# Patient Record
Sex: Male | Born: 1985 | Race: White | Hispanic: No | Marital: Single | Smoking: Never smoker
Health system: Southern US, Community
[De-identification: ages and names within clinical notes are randomized; demographics above are authoritative.]

---

## 2013-11-29 ENCOUNTER — Emergency Department (INDEPENDENT_AMBULATORY_CARE_PROVIDER_SITE_OTHER)
Admission: EM | Admit: 2013-11-29 | Discharge: 2013-11-29 | Disposition: A | Discharge status: Home / Self Care | Payer: No Typology Code available for payment source | Source: Home / Self Care | Attending: Family Medicine | Admitting: Family Medicine

## 2013-11-29 ENCOUNTER — Encounter (HOSPITAL_COMMUNITY): Payer: Self-pay | Admitting: Family Medicine

## 2013-11-29 DIAGNOSIS — H938X2 Other specified disorders of left ear: Secondary | ICD-10-CM

## 2013-11-29 DIAGNOSIS — H6123 Impacted cerumen, bilateral: Secondary | ICD-10-CM

## 2013-11-29 MED ORDER — ANTIPYRINE-BENZOCAINE 5.4-1.4 % OT SOLN: 3.0000 [drp] | Freq: Once | OTIC | Status: DC

## 2013-11-29 MED ORDER — IBUPROFEN 800 MG PO TABS
800.0000 mg | ORAL_TABLET | Freq: Once | ORAL | Status: AC
  Administered 2013-11-29: 800 mg via ORAL

## 2013-11-29 MED ORDER — ANTIPYRINE-BENZOCAINE 5.4-1.4 % OT SOLN: 3.0000 [drp] | OTIC | Status: DC | PRN

## 2013-11-29 MED ORDER — IBUPROFEN 800 MG PO TABS
ORAL_TABLET | ORAL | Status: AC
  Filled 2013-11-29: qty 1

## 2013-11-29 NOTE — Discharge Instructions (Signed)
The cause of your symptoms is likely from ear wax buildup in your ear canal. Your ears were only partially cleaned in our office due to the severity of the impaction  Remember to use a 1:1 H2O2:water mixture in your ears daily until they clear. Consider call[facility] for an appointment to have them cleaned if they do not improve. Please take ibuprofen 600mg  every 6 hours for a couple days if you are not better as this will help with possible eustachian tube dysfunction Consider starting nightly flonase (treat for at least 2 weeks) if you continue to have problems. This nasal steroid dose an excellent job of decreasing inflammation around the tube

## 2013-11-29 NOTE — ED Notes (Signed)
28 year old male with complaints of pain and fullness to his left ear.
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
## 2014-07-02 ENCOUNTER — Encounter (HOSPITAL_BASED_OUTPATIENT_CLINIC_OR_DEPARTMENT_OTHER): Payer: Self-pay

## 2014-07-02 ENCOUNTER — Emergency Department (HOSPITAL_BASED_OUTPATIENT_CLINIC_OR_DEPARTMENT_OTHER)
Admission: EM | Admit: 2014-07-02 | Discharge: 2014-07-02 | Disposition: A | Discharge status: Home / Self Care | Payer: No Typology Code available for payment source | Attending: Emergency Medicine | Admitting: Emergency Medicine

## 2014-07-02 ENCOUNTER — Emergency Department (HOSPITAL_BASED_OUTPATIENT_CLINIC_OR_DEPARTMENT_OTHER): Payer: No Typology Code available for payment source

## 2014-07-02 DIAGNOSIS — S92511A Displaced fracture of proximal phalanx of right lesser toe(s), initial encounter for closed fracture: Secondary | ICD-10-CM | POA: Insufficient documentation

## 2014-07-02 DIAGNOSIS — Y998 Other external cause status: Secondary | ICD-10-CM | POA: Insufficient documentation

## 2014-07-02 DIAGNOSIS — Y92828 Other wilderness area as the place of occurrence of the external cause: Secondary | ICD-10-CM | POA: Insufficient documentation

## 2014-07-02 DIAGNOSIS — Y9389 Activity, other specified: Secondary | ICD-10-CM | POA: Insufficient documentation

## 2014-07-02 DIAGNOSIS — W231XXA Caught, crushed, jammed, or pinched between stationary objects, initial encounter: Secondary | ICD-10-CM | POA: Insufficient documentation

## 2014-07-02 DIAGNOSIS — S92911A Unspecified fracture of right toe(s), initial encounter for closed fracture: Secondary | ICD-10-CM

## 2014-07-02 DIAGNOSIS — Y9316 Activity, rowing, canoeing, kayaking, rafting and tubing: Secondary | ICD-10-CM | POA: Insufficient documentation
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
## 2014-07-02 NOTE — ED Notes (Signed)
Patient transported to X-ray 

## 2014-07-02 NOTE — ED Notes (Signed)
Injury to right 4th toe 48 hours ago

## 2014-07-02 NOTE — Discharge Instructions (Signed)
Please call your doctor for a followup appointment within 24-48 hours. When you talk to your doctor please let them know that you were seen in the emergency department and have them acquire all of your records so that they can discuss the findings with you and formulate a treatment plan to fully care for your new and ongoing problems. Please follow-up with orthopedics Please rest and stay hydrated Please keep postop shoe on at all times and keep buddy taping the toes together Please rest, ice, elevate Please avoid any strenuous activity Please continue to monitor symptoms closely and if symptoms are to worsen or change (fever greater than 101, chills, sweating, nausea, vomiting, chest pain, shortness of breathe, difficulty breathing, weakness, numbness, tingling, worsening or changes to pain pattern, fall, injury, swelling, loss of sensation, changes to skin color) please report back to the Emergency Department immediately.    Buddy Taping of Toes We have taped your toes together to keep them from moving. This is called "buddy taping" since we used a part of your own body to keep the injured part still. We placed soft padding between your toes to keep them from rubbing against each other. Buddy taping will help with healing and to reduce pain. Keep your toes buddy taped together for as long as directed by your caregiver. HOME CARE INSTRUCTIONS   Raise your injured area above the level of your heart while sitting or lying down. Prop it up with pillows.  An ice pack used every twenty minutes, while awake, for the first one to two days may be helpful. Put ice in a plastic bag and put a towel between the bag and your skin.  Watch for signs that the taping is too tight. These signs may be:  Numbness of your taped toes.  Coolness of your taped toes.  Color change in the area beyond the tape.  Increased pain.  If you have any of these signs, loosen or rewrap the tape. If you need to loosen or  rewrap the buddy tape, make sure you use the padding again. SEEK IMMEDIATE MEDICAL CARE IF:   You have worse pain, swelling, inflammation (soreness), drainage or bleeding after you rewrap the tape.  Any new problems occur. MAKE SURE YOU:   Understand these instructions.  Will watch your condition.  Will get help right away if you are not doing well or get worse. Document Released: 10/09/2003 Document Revised: 03/29/2011 Document Reviewed: 01/02/2008 [facility] Patient Information 2015 [place], [place]. This information is not intended to replace advice given to you by your health care provider. Make sure you discuss any questions you have with your health care provider. Toe Fracture Your caregiver has diagnosed you as having a fractured toe. A toe fracture is a break in the bone of a toe. "Buddy taping" is a way of splinting your broken toe, by taping the broken toe to the toe next to it. This "buddy taping" will keep the injured toe from moving beyond normal range of motion. Buddy taping also helps the toe heal in a more normal alignment. It may take 6 to 8 weeks for the toe injury to heal. HOME CARE INSTRUCTIONS   Leave your toes taped together for as long as directed by your caregiver or until you see a doctor for a follow-up examination. You can change the tape after bathing. Always use a small piece of gauze or cotton between the toes when taping them together. This will help the skin stay dry and prevent  infection.  Apply ice to the injury for 15-20 minutes each hour while awake for the first 2 days. Put the ice in a plastic bag and place a towel between the bag of ice and your skin.  After the first 2 days, apply heat to the injured area. Use heat for the next 2 to 3 days. Place a heating pad on the foot or soak the foot in warm water as directed by your caregiver.  Keep your foot elevated as much as possible to lessen swelling.  Wear sturdy, supportive shoes. The shoes should not  pinch the toes or fit tightly against the toes.  Your caregiver may prescribe a rigid shoe if your foot is very swollen.  Your may be given crutches if the pain is too great and it hurts too much to walk.  Only take over-the-counter or prescription medicines for pain, discomfort, or fever as directed by your caregiver.  If your caregiver has given you a follow-up appointment, it is very important to keep that appointment. Not keeping the appointment could result in a chronic or permanent injury, pain, and disability. If there is any problem keeping the appointment, you must call back to this facility for assistance. SEEK MEDICAL CARE IF:   You have increased pain or swelling, not relieved with medications.  The pain does not get better after 1 week.  Your injured toe is cold when the others are warm. SEEK IMMEDIATE MEDICAL CARE IF:   The toe becomes cold, numb, or white.  The toe becomes hot (inflamed) and red. Document Released: 01/02/2000 Document Revised: 03/29/2011 Document Reviewed: 08/21/2007 [facility] Patient Information 2015 [place], [place]. This information is not intended to replace advice given to you by your health care provider. Make sure you discuss any questions you have with your health care provider.   Emergency Department Resource Guide 1) Find a Doctor and Pay Out of Pocket Although you won't have to find out who is covered by your insurance plan, it is a good idea to ask around and get recommendations. You will then need to call the office and see if the doctor you have chosen will accept you as a new patient and what types of options they offer for patients who are self-pay. Some doctors offer discounts or will set up payment plans for their patients who do not have insurance, but you will need to ask so you aren't surprised when you get to your appointment.  2) Contact Your Local Health Department Not all health departments have doctors that can see patients for  sick visits, but many do, so it is worth a call to see if yours does. If you don't know where your local health department is, you can check in your phone book. The CDC also has a tool to help you locate your state's health department, and many state websites also have listings of all of their local health departments.  3) Find a Walk-in Clinic If your illness is not likely to be very severe or complicated, you may want to try a walk in clinic. These are popping up all over the country in pharmacies, drugstores, and shopping centers. They're usually staffed by nurse practitioners or physician assistants that have been trained to treat common illnesses and complaints. They're usually fairly quick and inexpensive. However, if you have serious medical issues or chronic medical problems, these are probably not your best option.  No Primary Care Doctor: - Call Health Connect at  [phone] - they can  help you locate a primary care doctor that  accepts your insurance, provides certain services, etc. - Physician Referral Service- [phone]  Chronic Pain Problems: Organization         Address  Phone   Notes  [facility]  [phone] Patients need to be referred by their primary care doctor.   Medication Assistance: Organization         Address  Phone   Notes  [facility] Medication [facility] [address] [place] [place], [place] [address] [phone] --Must be a resident of [facility] -- Must have NO insurance coverage whatsoever (no Medicaid/ Medicare, etc.) -- The pt. MUST have a primary care doctor that directs their care regularly and follows them in the community   [facility]ist  [phone]   [employer]  [phone]    Agencies that provide inexpensive medical care: Organization         Address  Phone   Notes  [facility]  [phone]   [facility]    [phone]   [facility]  [address] [place], [place] [address] [phone]   [facility] [address] [place] [address], [place] [phone]   Planned Parenthood    [phone]   [facility]    [phone]   [facility]  [address], [place] Phone:  [phone], Fax:  [phone] Hours of Operation:  9 am - 6 pm, M-F.  Also accepts Medicaid/Medicare and self-pay.  [facility]  [address], [place] Phone: [phone], Fax: [phone]. Hours of Operation:  8:30 am - 5:30 pm, M-F.  Also accepts Medicaid and self-pay.  [facility] [place] [address] [place] Phone: [phone]   [facility] [address] Natasha Bence [place], [place] [phone], Ext. [phone] Mondays & Thursdays: 7-9 AM.  First 15 patients are seen on a first come, first serve basis.    Medicaid-accepting [facility] Providers:  Organization         Address  Phone   Notes  [facility] [address], [place] [phone] Also accepts self-pay patients.  [facility] [address] Laurell Josephs [place], [place]  [phone]   [facility] [address], [place] [phone]   [facility] [address], [place] [phone]   Renaye Rakers [address], [place]   [phone] Only accepts [place] Access [place] patients after they have their name applied to their card.   Self-Pay (no insurance) in [facility]:  Organization         Address  Phone   Notes  Sickle Cell Patients, [facility] [address] [place], [place] [phone]   [facility] [address] [place], [place] [phone]   [facility] [place]  [address], [place] [phone]   [facility]/Dr. Osei-Bonsu  [address] [place] or [address], [place] [phone] Phone number for both [place] and [place] locations is the same.  [facility] and [facility] [address] [place] [phone]   [facility] [address], [place] or [phone] [phone]   [facility] [address] [place] [phone], phone; [phone], fax Sees patients 1st and 3rd Saturday of every month.  Must not qualify for public or private insurance (i.e. Medicaid, Medicare, [place] Health Choice, Veterans' Benefits)  Household income should be no more than 200% of the poverty level The clinic cannot treat you if you are pregnant or think you are pregnant  Sexually transmitted diseases are not treated at the clinic.    Dental Care: Organization         Address  Phone  Notes  [facility] [address] [place], [place] [phone] Accepts children up to age 80 who are enrolled in [place] or [place] Health Choice; pregnant women with a Medicaid card; and children who have applied for Medicaid or [place] Health Choice, but were declined, whose parents can pay a reduced fee at time of service.  [facility] Department of [facility]  [address], [place] [phone] Accepts children up to age 36 who are enrolled in [place] or [place] Health Choice; pregnant women with a Medicaid card; and children who have applied for Medicaid or [place] Health Choice, but were declined, whose parents can pay a reduced fee at time of service.  [facility]  [address] [place], [place] [phone] Patients are seen by appointment only. Walk-ins are not accepted. [facility] will see patients 34 years of age and older. Monday - Tuesday (8am-5pm) Most Wednesdays (8:30-5pm) $30 per visit, cash only  [facility] Adult Dental Access PRO[facility]  [address], [facility] [phone] Patients are seen by appointment only. Walk-ins are not accepted. [facility] will see patients 70 years of age and older. One Wednesday Evening (Monthly: Volunteer Based).  $30 per visit, cash only  [employer]  [phone] for adults; Children under age 38, call Graduate Pediatric Dentistry at [phone]. Children aged 39-14, please call [phone] to request a pediatric application.  Dental services are provided in all areas of dental care including fillings, crowns and bridges, complete and partial dentures, implants, gum treatment, root canals, and extractions. Preventive care is also provided. Treatment is provided to both adults and children. Patients are selected via a lottery and there is often a waiting list.   [facility] [address], [place]  [phone] [email or web address]   [facility] [address] [place], [place] [phone], Ext. [phone] Second and Fourth Thursday of each month, opens at 6:30 AM; Clinic ends at 9 AM.  Patients are seen on a first-come first-served basis, and a limited number are seen during each clinic.   [facility]  [address] Ether Griffins [place], [place] [phone]   Eligibility Requirements You must have lived in [place], [place], or [place] counties for at least the last three months.   You cannot be eligible for state or federal sponsored [place], including [employer], [place], or [employer].   You generally cannot be eligible for healthcare insurance through your employer.    How to apply: Eligibility screenings are held every Tuesday and Wednesday afternoon from 1:00 pm until 4:00 pm. You do not need an appointment for the interview!  [facility] [address], [place], [place] [phone]   [facility]  [phone]   [facility] Department  [place] in the Community: Intensive Outpatient Programs Organization         Address  Phone  Notes  [facility] [place]. [address] [place], [place] [phone]   [facility] Outpatient [address], [place], [place]   ADS: [facility] [address], [place], [place]   [place] [address]  [place], [place] or [phone]   Substance Abuse Resources Organization         Address  Phone  Notes  Al[facility] Drug Services  [phone]   [place]  [phone]   [facility] [place]   Chinita Pester  [phone]   Residential & Outpatient Substance Abuse Program  [phone]   Psychological Services Organization         Address  Phone  Notes  [facility] [place]  [place]  [phone]   [place] [address] [place] or [phone]    Mobile Crisis Teams Organization         Address  Phone  Notes  [facility], Mobile Crisis Care Unit  [phone]   [facility]  [address] [place], [place]   [place] [address], [place] [phone]    Self-Help/Support Groups Organization         Address  Phone             Notes  [place]. of [place] - variety of support groups  [place] Call for more information  Narcotics Anonymous (NA), [facility] [address], [employer] [place]  2 meetings at this location   Special educational needs teacher         Address  Phone  Notes  [facility] [place],    [place]  [phone]   [facility]  [address] [place] [phone], [place], [place]   [place], [place]  [phone] Admissions: 8am-3pm M-F  Incentives Substance [place] [address]    [place], [place] [phone]   [facility] [address] [place], [place], [place]   [facility] [address],  [place], [place]   [facility] - Intensive Outpatient [address] Kristeen Mans [address], [place], [place]   [facility] ([place].,  [place], [place] [phone] or [phone]   [facility] ([facility]) [address] [place], [place] Accepts Medicaid  [facility] [address],  [place] [phone] Substance Abuse/Addiction Treatment   [facility] Organization         Address  Phone  Notes  [facility]  [phone]   Domenic Schwab, PhD [address] Arlis Porta [place], [place]   [phone] or [phone]   [place], [place] [phone]   [place] [address], [place], [place] [phone] Insurance/Medicaid/sponsorship through [employer] and Families [address], [id number]  [place], [place] [phone] [place], [place] [phone]    Dr. Adele Schilder  [phone]   [facility] Dept. 1) [address] [place] 2) [place] 3)  [address], [place] [phone]  [phone]   [place] [phone] or [phone] (After Hours)

## 2016-04-24 ENCOUNTER — Encounter (HOSPITAL_COMMUNITY): Payer: Self-pay | Admitting: Family Medicine

## 2016-04-24 ENCOUNTER — Ambulatory Visit (HOSPITAL_COMMUNITY)
Admission: EM | Admit: 2016-04-24 | Discharge: 2016-04-24 | Disposition: A | Discharge status: Home / Self Care | Payer: No Typology Code available for payment source | Attending: Internal Medicine | Admitting: Internal Medicine

## 2016-04-24 DIAGNOSIS — H60532 Acute contact otitis externa, left ear: Secondary | ICD-10-CM | POA: Diagnosis not present

## 2016-04-24 DIAGNOSIS — H6123 Impacted cerumen, bilateral: Secondary | ICD-10-CM

## 2016-04-24 DIAGNOSIS — H6692 Otitis media, unspecified, left ear: Secondary | ICD-10-CM | POA: Diagnosis not present

## 2016-04-24 MED ORDER — AMOXICILLIN-POT CLAVULANATE 875-125 MG PO TABS: 1.0000 | ORAL_TABLET | Freq: Two times a day (BID) | ORAL | 0 refills | Status: DC

## 2016-04-24 MED ORDER — NEOMYCIN-POLYMYXIN-HC 3.5-10000-1 OT SUSP: 4.0000 [drp] | Freq: Three times a day (TID) | OTIC | 0 refills | Status: DC

## 2016-04-24 NOTE — Discharge Instructions (Addendum)
Agree that many symptoms were hard to unify into one diagnosis.  Unclear whether 4/4's symptoms were food poisoning or onset of other viral syndrome, since no others known to have gotten sick.  Both ears impacted with wax, and with underlying canal irritation, which can contribute to episodic headache/jaw pain/sore throat.  Left ear also appears to have a middle ear infection.  Cortisporin ear drops and amoxicillin/clavulanate (antibiotic) [facility] [address]  Recheck for new fever >100.5, increasing phlegm production/nasal discharge, or if not starting to improve in a few days.

## 2016-04-24 NOTE — ED Triage Notes (Signed)
Pt here for jaw pain, headaches and ear pain ,sts all started after eating some bad food at a party and being sick afterwards. sts no pain right now but still  muffed hearing. sts also sore throat.

## 2016-04-24 NOTE — ED Provider Notes (Signed)
[facility]    CSN: [id number] Arrival date & time: 04/24/16  1556     History   Chief Complaint Chief Complaint  Patient presents with  . Jaw Pain  . Otalgia    HPI Jeffrey Erickson is a 31 y.o. male. He presents today with several days of symptoms. He had stomach discomfort after eating hot dogs at a company dinner party on April 4, and he self-induced vomiting 20-25 times subsequently. No diarrhea. Does have some residual throat discomfort with swallowing. In the last 2 days, he's had left-sided headache in the evening.  Yesterday's headache was accompanied by left-sided jaw pain, radiated from the ear to the mid jaw. Dull discomfort. He had eaten some dry crackers, really hard, before the pain started. No change in vision. No difficulty with chewing and swallowing. Does have some decrease in hearing. No fever. Slight cough, equivocal nasal congestion. Some decrease in appetite. Worried that he will have another headache this evening, and not be able to sleep.    HPI  History reviewed. No pertinent past medical history.  History reviewed. No pertinent surgical history.     Home Medications   Takes no meds regularly.  Took some aspirins for jaw pain/headache in the last couple days.  Family History Family History  Problem Relation Age of Onset  . Cancer Maternal Grandmother     breast    Social History Social History  Substance Use Topics  . Smoking status: Never Smoker  . Smokeless tobacco: Never Used  . Alcohol use No     Allergies   Patient has no known allergies.   Review of Systems Review of Systems  All other systems reviewed and are negative.    Physical Exam Triage Vital Signs ED Triage Vitals  Enc Vitals Group     BP 04/24/16 1626 133/80     Pulse Rate 04/24/16 1626 98     Resp 04/24/16 1626 18     Temp 04/24/16 1626 99.5 F (37.5 C)     Temp Source 04/24/16 1626 Oral     SpO2 04/24/16 1626 95 %     Weight --      Height --      Pain Score 04/24/16 1627 5     Pain Loc --      Pain Edu? --    Updated Vital Signs BP 133/80   Pulse 98   Temp 99.5 F (37.5 C) (Oral)   Resp 18   SpO2 95%   Physical Exam  Constitutional: He is oriented to person, place, and time. No distress.  Alert, nicely groomed  HENT:  Head: Atraumatic.  B ear canals occluded by brown sticky wax Mod nasal congestion Throat is red  Eyes:  Conjugate gaze, no eye redness/drainage  Neck: Neck supple.  Cardiovascular: Normal rate and regular rhythm.   Pulmonary/Chest: No respiratory distress. He has no wheezes. He has no rales.  Lungs clear, symmetric breath sounds    Abdominal: He exhibits no distension.  Musculoskeletal: Normal range of motion.  Neurological: He is alert and oriented to person, place, and time.  Skin: Skin is warm and dry.  No cyanosis  Nursing note and vitals reviewed.    UC Treatments / Results   Procedures Procedures (including critical care time) Ears irrigated free of wax by clinical staff with good result.  Ear canals and TMs inflamed/red, L>>R.  Final Clinical Impressions(s) / UC Diagnoses   Final diagnoses:  Acute contact otitis externa  of left ear  Acute left otitis media  Bilateral impacted cerumen   Agree that many symptoms were hard to unify into one diagnosis.  Unclear whether 4/4's symptoms were food poisoning or onset of other viral syndrome, since no others known to have gotten sick.  Both ears impacted with wax, and with underlying canal irritation, which can contribute to episodic headache/jaw pain/sore throat.  Left ear also appears to have a middle ear infection.  Cortisporin ear drops and amoxicillin/clavulanate (antibiotic) [facility] [address]  Recheck for new fever >100.5, increasing phlegm production/nasal discharge, or if not starting to improve in a few days.  New Prescriptions Discharge Medication List as of 04/24/2016  5:54 PM    START taking these  medications   Details  amoxicillin-clavulanate (AUGMENTIN) 875-125 MG tablet Take 1 tablet by mouth every 12 (twelve) hours., Starting Sat 04/24/2016, Normal    neomycin-polymyxin-hydrocortisone (CORTISPORIN) 3.5-10000-1 otic suspension Place 4 drops into both ears 3 (three) times daily., Starting Sat 04/24/2016, Normal         Eustace Moore, MD 04/25/16 [phone]

## 2016-06-30 ENCOUNTER — Encounter (HOSPITAL_COMMUNITY): Payer: Self-pay | Admitting: Emergency Medicine

## 2016-06-30 ENCOUNTER — Ambulatory Visit (HOSPITAL_COMMUNITY)
Admission: EM | Admit: 2016-06-30 | Discharge: 2016-06-30 | Disposition: A | Discharge status: Home / Self Care | Payer: Self-pay | Attending: Family Medicine | Admitting: Family Medicine

## 2016-06-30 DIAGNOSIS — B009 Herpesviral infection, unspecified: Secondary | ICD-10-CM | POA: Insufficient documentation

## 2016-06-30 MED ORDER — PREDNISONE 10 MG (21) PO TBPK: ORAL_TABLET | ORAL | 0 refills | Status: AC

## 2016-06-30 MED ORDER — VALACYCLOVIR HCL 1 G PO TABS: 1000.0000 mg | ORAL_TABLET | Freq: Two times a day (BID) | ORAL | 0 refills | Status: AC

## 2016-06-30 NOTE — Discharge Instructions (Signed)
Treating you for herpes simplex, samples have been taken for culture, started on Valtrex, and prednisone for your symptoms. This rash should resolve in one week or less. If your symptoms persist return to clinic as necessary.

## 2016-06-30 NOTE — ED Provider Notes (Signed)
CSN: [id number]     Arrival date & time 06/30/16  1356 History   First MD Initiated Contact with Patient 06/30/16 1451     Chief Complaint  Patient presents with  . Rash   (Consider location/radiation/quality/duration/timing/severity/associated sxs/prior Treatment) The history is provided by the patient.  Rash  Location:  Shoulder/arm and torso Shoulder/arm rash location:  L arm Torso rash location:  L axilla, upper back and R chest Quality: blistering, itchiness, redness and weeping   Severity:  Moderate Onset quality:  Gradual Duration:  4 days Timing:  Constant Progression:  Worsening Chronicity:  New Context: plant contact   Context: not animal contact, not food, not insect bite/sting, not medications and not new detergent/soap   Worsened by:  Nothing Ineffective treatments:  None tried Associated symptoms: no fatigue, no fever, no nausea, no throat swelling, no tongue swelling and not wheezing     History reviewed. No pertinent past medical history. History reviewed. No pertinent surgical history. Family History  Problem Relation Age of Onset  . Cancer Maternal Grandmother        breast   Social History  Substance Use Topics  . Smoking status: Never Smoker  . Smokeless tobacco: Never Used  . Alcohol use No    Review of Systems  Constitutional: Negative for chills, fatigue and fever.  HENT: Negative.   Respiratory: Negative for wheezing.   Gastrointestinal: Negative for nausea.  Musculoskeletal: Negative.   Skin: Positive for rash.  Neurological: Negative.     Allergies  Patient has no known allergies.  Home Medications   Prior to Admission medications   Medication Sig Start Date End Date Taking? Authorizing Provider  predniSONE (STERAPRED UNI-PAK 21 TAB) 10 MG (21) TBPK tablet Take 6 tablets tomorrow, decrease by 1 each day till finished (9,1,4,7,8,2(6,5,4,3,2,1) 06/30/16   Dorena BodoKennard, Auriella Wieand, NP  valACYclovir (VALTREX) 1000 MG tablet Take 1 tablet (1,000 mg total)  by mouth 2 (two) times daily. 06/30/16 07/14/16  Dorena BodoKennard, Kayvon Mo, NP   Meds Ordered and Administered this Visit  Medications - No data to display  BP 102/70 (BP Location: Right Arm)   Pulse 82   Temp 97.5 F (36.4 C) (Oral)   SpO2 96%  No data found.   Physical Exam  Constitutional: He is oriented to person, place, and time. He appears well-developed and well-nourished. No distress.  HENT:  Head: Normocephalic and atraumatic.  Right Ear: External ear normal.  Left Ear: External ear normal.  Eyes: Conjunctivae are normal.  Neck: Normal range of motion. Neck supple.  Cardiovascular: Normal rate and regular rhythm.   Pulmonary/Chest: Effort normal and breath sounds normal.  Neurological: He is alert and oriented to person, place, and time.  Skin: Skin is warm and dry. Capillary refill takes less than 2 seconds. Rash noted. He is not diaphoretic.  See attached photographs  Psychiatric: He has a normal mood and affect. His behavior is normal.  Nursing note and vitals reviewed.           Urgent Care Course     Procedures (including critical care time)  Labs Review Labs Reviewed  HSV CULTURE AND TYPING    Imaging Review No results found.      MDM   1. Herpes simplex    HSV culture and typing obtained, started on Valtrex, given prednisone, follow-up guidelines and counseling provided, return to clinic as necessary.    Dorena BodoKennard, Attallah Ontko, NP 06/30/16 [phone]

## 2016-06-30 NOTE — ED Triage Notes (Signed)
Pt reports a rash on his left arm, axillary, chest, and back.  They are red and appear like small blisters in concentrated areas.

## 2016-07-02 LAB — HSV CULTURE AND TYPING
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
